# Patient Record
Sex: Female | Born: 1965
Health system: Southern US, Community
[De-identification: ages and names within clinical notes are randomized; demographics above are authoritative.]

## PROBLEM LIST (undated history)

## (undated) HISTORY — PX: CYST EXCISION: SHX5701

---

## 2002-05-01 ENCOUNTER — Encounter: Payer: Self-pay | Admitting: Emergency Medicine

## 2002-05-01 ENCOUNTER — Emergency Department (HOSPITAL_COMMUNITY): Admission: EM | Admit: 2002-05-01 | Discharge: 2002-05-02 | Payer: Self-pay | Admitting: *Deleted

## 2003-01-19 ENCOUNTER — Encounter: Payer: Self-pay | Admitting: Emergency Medicine

## 2003-01-19 ENCOUNTER — Emergency Department (HOSPITAL_COMMUNITY): Admission: EM | Admit: 2003-01-19 | Discharge: 2003-01-20 | Payer: Self-pay | Admitting: Emergency Medicine

## 2003-06-24 ENCOUNTER — Other Ambulatory Visit: Admission: RE | Admit: 2003-06-24 | Discharge: 2003-06-24 | Payer: Self-pay | Admitting: Gynecology

## 2003-06-26 ENCOUNTER — Encounter: Payer: Self-pay | Admitting: Gynecology

## 2003-06-26 ENCOUNTER — Encounter: Admission: RE | Admit: 2003-06-26 | Discharge: 2003-06-26 | Payer: Self-pay | Admitting: Gynecology

## 2004-12-30 ENCOUNTER — Emergency Department (HOSPITAL_COMMUNITY): Admission: EM | Admit: 2004-12-30 | Discharge: 2004-12-30 | Payer: Self-pay | Admitting: Emergency Medicine

## 2006-11-13 ENCOUNTER — Other Ambulatory Visit: Admission: RE | Admit: 2006-11-13 | Discharge: 2006-11-13 | Payer: Self-pay | Admitting: Family Medicine

## 2007-03-25 ENCOUNTER — Encounter: Admission: RE | Admit: 2007-03-25 | Discharge: 2007-03-25 | Payer: Self-pay | Admitting: Family Medicine

## 2008-02-24 ENCOUNTER — Other Ambulatory Visit: Admission: RE | Admit: 2008-02-24 | Discharge: 2008-02-24 | Payer: Self-pay | Admitting: Family Medicine

## 2008-12-07 ENCOUNTER — Other Ambulatory Visit: Admission: RE | Admit: 2008-12-07 | Discharge: 2008-12-07 | Payer: Self-pay | Admitting: Family Medicine

## 2013-01-17 ENCOUNTER — Other Ambulatory Visit: Payer: Self-pay | Admitting: Physician Assistant

## 2013-01-17 ENCOUNTER — Other Ambulatory Visit (HOSPITAL_COMMUNITY)
Admission: RE | Admit: 2013-01-17 | Discharge: 2013-01-17 | Disposition: A | Discharge status: Home / Self Care | Payer: BC Managed Care – PPO | Source: Ambulatory Visit | Attending: Family Medicine | Admitting: Family Medicine

## 2013-01-17 DIAGNOSIS — Z01419 Encounter for gynecological examination (general) (routine) without abnormal findings: Secondary | ICD-10-CM | POA: Insufficient documentation

## 2014-09-02 ENCOUNTER — Emergency Department (HOSPITAL_BASED_OUTPATIENT_CLINIC_OR_DEPARTMENT_OTHER)
Admission: EM | Admit: 2014-09-02 | Discharge: 2014-09-03 | Disposition: A | Discharge status: Home / Self Care | Payer: BC Managed Care – PPO | Attending: Emergency Medicine | Admitting: Emergency Medicine

## 2014-09-02 ENCOUNTER — Encounter (HOSPITAL_BASED_OUTPATIENT_CLINIC_OR_DEPARTMENT_OTHER): Payer: Self-pay

## 2014-09-02 DIAGNOSIS — G5601 Carpal tunnel syndrome, right upper limb: Secondary | ICD-10-CM | POA: Diagnosis not present

## 2014-09-02 DIAGNOSIS — R591 Generalized enlarged lymph nodes: Secondary | ICD-10-CM | POA: Insufficient documentation

## 2014-09-02 DIAGNOSIS — J069 Acute upper respiratory infection, unspecified: Secondary | ICD-10-CM | POA: Diagnosis not present

## 2014-09-02 DIAGNOSIS — G5603 Carpal tunnel syndrome, bilateral upper limbs: Secondary | ICD-10-CM

## 2014-09-02 DIAGNOSIS — R590 Localized enlarged lymph nodes: Secondary | ICD-10-CM

## 2014-09-02 DIAGNOSIS — G5602 Carpal tunnel syndrome, left upper limb: Secondary | ICD-10-CM | POA: Diagnosis not present

## 2014-09-02 MED ORDER — IBUPROFEN 400 MG PO TABS
600.0000 mg | ORAL_TABLET | Freq: Once | ORAL | Status: AC
  Administered 2014-09-03: 600 mg via ORAL
  Filled 2014-09-02 (×2): qty 1

## 2014-09-02 NOTE — ED Notes (Signed)
C/o "lump" to left side of neck x 2 hours

## 2014-09-02 NOTE — ED Provider Notes (Signed)
CSN: [id number]     Arrival date & time 09/02/14  2208 History  This chart was scribed for Brenda Raceravid Suprena Travaglini, MD by Evon Slackerrance Branch, ED Scribe. This patient was seen in room MH08/MH08 and the patient's care was started at 11:07 PM.     Chief Complaint  Patient presents with  . Lymphadenopathy   HPI HPI Comments: Brenda Cooper is a 48 y.o. female who presents to the Emergency Department complaining of left sided cervical mass onset 2 hours PTA. She states that its painful and burning. She states she has some rhinorrhea. No fever or chills.  Patient also complains of episodic bilateral hand numbness especially over the first 3 digits of either hand for the past several weeks. This is worse at night. 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 She's had no weakness. History reviewed. No pertinent past medical history. Past Surgical History  Procedure Laterality Date  . Cyst excision     No family history on file. History  Substance Use Topics  . Smoking status: Never Smoker   . Smokeless tobacco: Not on file  . Alcohol Use: No   OB History    No data available     Review of Systems  Constitutional: Negative for fever and chills.  HENT: Positive for congestion and rhinorrhea. Negative for sinus pressure and sore throat.   Eyes: Negative for visual disturbance.  Respiratory: Negative for chest tightness and shortness of breath.   Cardiovascular: Negative for chest pain.  Gastrointestinal: Negative for nausea and abdominal pain.  Musculoskeletal: Positive for neck pain. Negative for back pain and neck stiffness.  Skin: Negative for rash and wound.  Neurological: Negative for dizziness, weakness, numbness and headaches.  All other systems reviewed and are negative.     Allergies  Review of patient's allergies indicates no known allergies.  Home Medications   Prior to Admission medications   Medication Sig Start Date End Date Taking? Authorizing Provider  FLUoxetine  (PROZAC) 40 MG capsule Take 40 mg by mouth daily.   Yes Historical Provider, MD  Montelukast Sodium (SINGULAIR PO) Take by mouth.   Yes Historical Provider, MD  ibuprofen (ADVIL,MOTRIN) 600 MG tablet Take 1 tablet (600 mg total) by mouth every 6 (six) hours as needed. 09/03/14   Brenda Raceravid Kinan Safley, MD   BP 122/78 mmHg  Pulse 88  Temp(Src) 97.7 F (36.5 C) (Oral)  Resp 18  Ht 5\' 5"  (1.651 m)  Wt 260 lb (117.935 kg)  BMI 43.27 kg/m2  SpO2 100%  LMP 08/12/2014   Physical Exam  Constitutional: She is oriented to person, place, and time. She appears well-developed and well-nourished. No distress.  HENT:  Head: Normocephalic and atraumatic.  Mouth/Throat: Oropharynx is clear and moist. No oropharyngeal exudate.  Eyes: EOM are normal. Pupils are equal, round, and reactive to light.  Neck: Normal range of motion. Neck supple.  No meningismus  Cardiovascular: Normal rate and regular rhythm.   Pulmonary/Chest: Effort normal and breath sounds normal. No respiratory distress. She has no wheezes. She has no rales. She exhibits no tenderness.  Abdominal: Soft. Bowel sounds are normal. She exhibits no distension and no mass. There is no tenderness. There is no rebound and no guarding.  Musculoskeletal: Normal range of motion. She exhibits no edema or tenderness.  Negative Tinel's and Phalen's sign.  Lymphadenopathy:    She has cervical adenopathy (left-sided posterior cervical lymphadenopathy. ).  Neurological: She is alert and oriented to person, place, and time.  Patient  is alert and oriented x3 with clear, goal oriented speech. Patient has 5/5 motor in all extremities. Sensation is intact to light touch. Patient has a normal gait and walks without assistance.  Skin: Skin is warm and dry. No rash noted. No erythema.  Psychiatric: She has a normal mood and affect. Her behavior is normal.  Nursing note and vitals reviewed.   ED Course  Procedures (including critical care time) Labs Review Labs  Reviewed - No data to display  Imaging Review No results found.   EKG Interpretation None      MDM   Final diagnoses:  URI (upper respiratory infection)  Lymphadenopathy of left cervical region  Carpal tunnel syndrome on both sides      I personally performed the services described in this documentation, which was scribed in my presence. The recorded information has been reviewed and is accurate.  Patient with reactive lymphadenopathy likely due to URI. Patient also describes symptoms of possible tunnel syndrome. We'll place in a Velcro splints been referred to Hydrographic surveyorhand surgeon. Return precautions given.     Brenda Raceravid Nickson Middlesworth, MD 09/03/14 [phone]

## 2014-09-03 MED ORDER — IBUPROFEN 600 MG PO TABS: 600.0000 mg | ORAL_TABLET | Freq: Four times a day (QID) | ORAL | Status: AC | PRN

## 2014-09-03 NOTE — Discharge Instructions (Signed)
Carpal Tunnel Syndrome The carpal tunnel is a narrow area located on the palm side of your wrist. The tunnel is formed by the wrist bones and ligaments. Nerves, blood vessels, and tendons pass through the carpal tunnel. Repeated wrist motion or certain diseases may cause swelling within the tunnel. This swelling pinches the main nerve in the wrist (median nerve) and causes the painful hand and arm condition called carpal tunnel syndrome. CAUSES   Repeated wrist motions.  Wrist injuries.  Certain diseases like arthritis, diabetes, alcoholism, hyperthyroidism, and kidney failure.  Obesity.  Pregnancy. SYMPTOMS   A "pins and needles" feeling in your fingers or hand, especially in your thumb, index and middle fingers.  Tingling or numbness in your fingers or hand.  An aching feeling in your entire arm, especially when your wrist and elbow are bent for long periods of time.  Wrist pain that goes up your arm to your shoulder.  Pain that goes down into your palm or fingers.  A weak feeling in your hands. DIAGNOSIS  Your health care provider will take your history and perform a physical exam. An electromyography test may be needed. This test measures electrical signals sent out by your nerves into the muscles. The electrical signals are usually slowed by carpal tunnel syndrome. You may also need X-rays. TREATMENT  Carpal tunnel syndrome may clear up by itself. Your health care provider may recommend a wrist splint or medicine such as a nonsteroidal anti-inflammatory medicine. Cortisone injections may help. Sometimes, surgery may be needed to free the pinched nerve.  HOME CARE INSTRUCTIONS   Take all medicine as directed by your health care provider. Only take over-the-counter or prescription medicines for pain, discomfort, or fever as directed by your health care provider.  If you were given a splint to keep your wrist from bending, wear it as directed. It is important to wear the splint at  night. Wear the splint for as long as you have pain or numbness in your hand, arm, or wrist. This may take 1 to 2 months.  Rest your wrist from any activity that may be causing your pain. If your symptoms are work-related, you may need to talk to your employer about changing to a job that does not require using your wrist.  Put ice on your wrist after long periods of wrist activity.  Put ice in a plastic bag.  Place a towel between your skin and the bag.  Leave the ice on for 15-20 minutes, 03-04 times a day.  Keep all follow-up visits as directed by your health care provider. This includes any orthopedic referrals, physical therapy, and rehabilitation. Any delay in getting necessary care could result in a delay or failure of your condition to heal. SEEK IMMEDIATE MEDICAL CARE IF:   You have new, unexplained symptoms.  Your symptoms get worse and are not helped or controlled with medicines. MAKE SURE YOU:   Understand these instructions.  Will watch your condition.  Will get help right away if you are not doing well or get worse. Document Released: 09/01/2000 Document Revised: 01/19/2014 Document Reviewed: 07/21/2011 [facility] Patient Information 2015 [place], [place]. This information is not intended to replace advice given to you by your health care provider. Make sure you discuss any questions you have with your health care provider.  Lymphadenopathy Lymphadenopathy means "disease of the lymph glands." But the term is usually used to describe swollen or enlarged lymph glands, also called lymph nodes. These are the bean-shaped organs found in  many locations including the neck, underarm, and groin. Lymph glands are part of the immune system, which fights infections in your body. Lymphadenopathy can occur in just one area of the body, such as the neck, or it can be generalized, with lymph node enlargement in several areas. The nodes found in the neck are the most common sites of  lymphadenopathy. CAUSES When your immune system responds to germs (such as viruses or bacteria ), infection-fighting cells and fluid build up. This causes the glands to grow in size. Usually, this is not something to worry about. Sometimes, the glands themselves can become infected and inflamed. This is called lymphadenitis. Enlarged lymph nodes can be caused by many diseases:  Bacterial disease, such as strep throat or a skin infection.  Viral disease, such as a common cold.  Other germs, such as Lyme disease, tuberculosis, or sexually transmitted diseases.  Cancers, such as lymphoma (cancer of the lymphatic system) or leukemia (cancer of the white blood cells).  Inflammatory diseases such as lupus or rheumatoid arthritis.  Reactions to medications. Many of the diseases above are rare, but important. This is why you should see your caregiver if you have lymphadenopathy. SYMPTOMS  Swollen, enlarged lumps in the neck, back of the head, or other locations.  Tenderness.  Warmth or redness of the skin over the lymph nodes.  Fever. DIAGNOSIS Enlarged lymph nodes are often near the source of infection. They can help health care providers diagnose your illness. For instance:  Swollen lymph nodes around the jaw might be caused by an infection in the mouth.  Enlarged glands in the neck often signal a throat infection.  Lymph nodes that are swollen in more than one area often indicate an illness caused by a virus. Your caregiver will likely know what is causing your lymphadenopathy after listening to your history and examining you. Blood tests, x-rays, or other tests may be needed. If the cause of the enlarged lymph node cannot be found, and it does not go away by itself, then a biopsy may be needed. Your caregiver will discuss this with you. TREATMENT Treatment for your enlarged lymph nodes will depend on the cause. Many times the nodes will shrink to normal size by themselves, with no  treatment. Antibiotics or other medicines may be needed for infection. Only take over-the-counter or prescription medicines for pain, discomfort, or fever as directed by your caregiver. HOME CARE INSTRUCTIONS Swollen lymph glands usually return to normal when the underlying medical condition goes away. If they persist, contact your health-care provider. He/she might prescribe antibiotics or other treatments, depending on the diagnosis. Take any medications exactly as prescribed. Keep any follow-up appointments made to check on the condition of your enlarged nodes. SEEK MEDICAL CARE IF:  Swelling lasts for more than two weeks.  You have symptoms such as weight loss, night sweats, fatigue, or fever that does not go away.  The lymph nodes are hard, seem fixed to the skin, or are growing rapidly.  Skin over the lymph nodes is red and inflamed. This could mean there is an infection. SEEK IMMEDIATE MEDICAL CARE IF:  Fluid starts leaking from the area of the enlarged lymph node.  You develop a fever of 102 F (38.9 C) or greater.  Severe pain develops (not necessarily at the site of a large lymph node).  You develop chest pain or shortness of breath.  You develop worsening abdominal pain. MAKE SURE YOU:  Understand these instructions.  Will watch your condition.  Will get help right away if you are not doing well or get worse. Document Released: 06/13/2008 Document Revised: 01/19/2014 Document Reviewed: 06/13/2008 [facility] Patient Information 2015 [place]. This information is not intended to replace advice given to you by your health care provider. Make sure you discuss any questions you have with your health care provider.  Upper Respiratory Infection, Adult An upper respiratory infection (URI) is also sometimes known as the common cold. The upper respiratory tract includes the nose, sinuses, throat, trachea, and bronchi. Bronchi are the airways leading to the lungs. Most people  improve within 1 week, but symptoms can last up to 2 weeks. A residual cough may last even longer.  CAUSES Many different viruses can infect the tissues lining the upper respiratory tract. The tissues become irritated and inflamed and often become very moist. Mucus production is also common. A cold is contagious. You can easily spread the virus to others by oral contact. This includes kissing, sharing a glass, coughing, or sneezing. Touching your mouth or nose and then touching a surface, which is then touched by another person, can also spread the virus. SYMPTOMS  Symptoms typically develop 1 to 3 days after you come in contact with a cold virus. Symptoms vary from person to person. They may include:  Runny nose.  Sneezing.  Nasal congestion.  Sinus irritation.  Sore throat.  Loss of voice (laryngitis).  Cough.  Fatigue.  Muscle aches.  Loss of appetite.  Headache.  Low-grade fever. DIAGNOSIS  You might diagnose your own cold based on familiar symptoms, since most people get a cold 2 to 3 times a year. Your caregiver can confirm this based on your exam. Most importantly, your caregiver can check that your symptoms are not due to another disease such as strep throat, sinusitis, pneumonia, asthma, or epiglottitis. Blood tests, throat tests, and X-rays are not necessary to diagnose a common cold, but they may sometimes be helpful in excluding other more serious diseases. Your caregiver will decide if any further tests are required. RISKS AND COMPLICATIONS  You may be at risk for a more severe case of the common cold if you smoke cigarettes, have chronic heart disease (such as heart failure) or lung disease (such as asthma), or if you have a weakened immune system. The very young and very old are also at risk for more serious infections. Bacterial sinusitis, middle ear infections, and bacterial pneumonia can complicate the common cold. The common cold can worsen asthma and chronic  obstructive pulmonary disease (COPD). Sometimes, these complications can require emergency medical care and may be life-threatening. PREVENTION  The best way to protect against getting a cold is to practice good hygiene. Avoid oral or hand contact with people with cold symptoms. Wash your hands often if contact occurs. There is no clear evidence that vitamin C, vitamin E, echinacea, or exercise reduces the chance of developing a cold. However, it is always recommended to get plenty of rest and practice good nutrition. TREATMENT  Treatment is directed at relieving symptoms. There is no cure. Antibiotics are not effective, because the infection is caused by a virus, not by bacteria. Treatment may include:  Increased fluid intake. Sports drinks offer valuable electrolytes, sugars, and fluids.  Breathing heated mist or steam (vaporizer or shower).  Eating chicken soup or other clear broths, and maintaining good nutrition.  Getting plenty of rest.  Using gargles or lozenges for comfort.  Controlling fevers with ibuprofen or acetaminophen as directed by your caregiver.  Increasing usage of your inhaler if you have asthma. Zinc gel and zinc lozenges, taken in the first 24 hours of the common cold, can shorten the duration and lessen the severity of symptoms. Pain medicines may help with fever, muscle aches, and throat pain. A variety of non-prescription medicines are available to treat congestion and runny nose. Your caregiver can make recommendations and may suggest nasal or lung inhalers for other symptoms.  HOME CARE INSTRUCTIONS   Only take over-the-counter or prescription medicines for pain, discomfort, or fever as directed by your caregiver.  Use a warm mist humidifier or inhale steam from a shower to increase air moisture. This may keep secretions moist and make it easier to breathe.  Drink enough water and fluids to keep your urine clear or pale yellow.  Rest as needed.  Return to work  when your temperature has returned to normal or as your caregiver advises. You may need to stay home longer to avoid infecting others. You can also use a face mask and careful hand washing to prevent spread of the virus. SEEK MEDICAL CARE IF:   After the first few days, you feel you are getting worse rather than better.  You need your caregiver's advice about medicines to control symptoms.  You develop chills, worsening shortness of breath, or brown or red sputum. These may be signs of pneumonia.  You develop yellow or brown nasal discharge or pain in the face, especially when you bend forward. These may be signs of sinusitis.  You develop a fever, swollen neck glands, pain with swallowing, or white areas in the back of your throat. These may be signs of strep throat. SEEK IMMEDIATE MEDICAL CARE IF:   You have a fever.  You develop severe or persistent headache, ear pain, sinus pain, or chest pain.  You develop wheezing, a prolonged cough, cough up blood, or have a change in your usual mucus (if you have chronic lung disease).  You develop sore muscles or a stiff neck. Document Released: 02/28/2001 Document Revised: 11/27/2011 Document Reviewed: 12/10/2013 [facility] Patient Information 2015 [place], [place]. This information is not intended to replace advice given to you by your health care provider. Make sure you discuss any questions you have with your health care provider.

## 2016-01-07 ENCOUNTER — Other Ambulatory Visit: Payer: Self-pay | Admitting: Surgical Oncology

## 2016-01-07 DIAGNOSIS — Z9989 Dependence on other enabling machines and devices: Secondary | ICD-10-CM

## 2016-01-07 DIAGNOSIS — G4733 Obstructive sleep apnea (adult) (pediatric): Secondary | ICD-10-CM

## 2016-01-25 ENCOUNTER — Ambulatory Visit
Admission: RE | Admit: 2016-01-25 | Discharge: 2016-01-25 | Disposition: A | Discharge status: Home / Self Care | Payer: BLUE CROSS/BLUE SHIELD | Source: Ambulatory Visit | Attending: Surgical Oncology | Admitting: Surgical Oncology

## 2016-01-25 DIAGNOSIS — G4733 Obstructive sleep apnea (adult) (pediatric): Secondary | ICD-10-CM

## 2016-01-25 DIAGNOSIS — Z9989 Dependence on other enabling machines and devices: Secondary | ICD-10-CM

## 2016-02-23 ENCOUNTER — Emergency Department (HOSPITAL_COMMUNITY)
Admission: EM | Admit: 2016-02-23 | Discharge: 2016-02-23 | Disposition: A | Discharge status: Home / Self Care | Payer: BLUE CROSS/BLUE SHIELD | Attending: Emergency Medicine | Admitting: Emergency Medicine

## 2016-02-23 ENCOUNTER — Encounter (HOSPITAL_COMMUNITY): Payer: Self-pay | Admitting: Emergency Medicine

## 2016-02-23 DIAGNOSIS — Z23 Encounter for immunization: Secondary | ICD-10-CM | POA: Insufficient documentation

## 2016-02-23 DIAGNOSIS — Z203 Contact with and (suspected) exposure to rabies: Secondary | ICD-10-CM | POA: Diagnosis not present

## 2016-02-23 MED ORDER — RABIES IMMUNE GLOBULIN 150 UNIT/ML IM INJ
20.0000 [IU]/kg | INJECTION | Freq: Once | INTRAMUSCULAR | Status: AC
Start: 2016-02-23 — End: 2016-02-23
  Administered 2016-02-23: 2625 [IU] via INTRAMUSCULAR
  Filled 2016-02-23: qty 17.5

## 2016-02-23 MED ORDER — RABIES VACCINE, PCEC IM SUSR
1.0000 mL | Freq: Once | INTRAMUSCULAR | Status: AC
  Administered 2016-02-23: 1 mL via INTRAMUSCULAR
  Filled 2016-02-23: qty 1

## 2016-02-23 MED ORDER — RABIES VIRUS VACCINE, HDC IM INJ: 1.0000 mL | INJECTION | Freq: Once | INTRAMUSCULAR | Status: DC

## 2016-02-23 MED ORDER — RABIES IMMUNE GLOBULIN 150 UNIT/ML IM INJ: 20.0000 [IU]/kg | INJECTION | Freq: Once | INTRAMUSCULAR | Status: DC

## 2016-02-23 NOTE — Discharge Instructions (Signed)
Please follow up with [facility] for further vaccine doses. See below                                    RABIES VACCINE FOLLOW UP  Patient's Name: Brenda Cooper                     Original Order Date:02/23/2016  Medical Record Number: [id number]  ED Physician: Lorre Nick, MD Primary Diagnosis: Rabies Exposure       PCP: Cala Bradford, MD  Patient Phone Number: (home) [phone] (home)    (cell)  Telephone Information:  Mobile [phone]    (work) [phone] (work) Species of Animal:     You have been seen in the Emergency Department for a possible rabies exposure. It's very important you return for the additional vaccine doses.  Please call the clinic listed below for hours of operation.   Clinic that will administer your rabies vaccines:    DAY 0:  02/23/2016      DAY 3:  02/26/2016       DAY 7:  03/01/2016     DAY 14:  03/08/2016          Rabies Vaccine: What You Need to Know WHAT IS RABIES?  Rabies is a serious disease. It is caused by a virus.  Rabies is mainly a disease of animals. Humans get rabies when they are bitten by infected animals.  At first there might not be any symptoms. But weeks, or even years after a bite, rabies can cause pain, fatigue, headaches, fever, and irritability. These are followed by seizures, hallucinations, and paralysis. Human rabies is almost always fatal.  Wild animals, especially bats, are the most common source of human rabies infection in the [place]. Skunks, raccoons, dogs, cats, coyotes, foxes, and other mammals can also transmit the disease.  Human rabies is rare in the [place]. There have been only 55 cases diagnosed since 1990. However, between 16,000 and 39,000 people are vaccinated each year as a precaution after animal bites. Also, rabies is far more common in other parts of the world, with about 40,000 to 70,000 rabies-related deaths worldwide each year. Bites from unvaccinated dogs cause most of  these cases. Rabies vaccine can prevent rabies. RABIES VACCINE  Rabies vaccine is given to people at high risk of rabies to protect them if they are exposed. It can also prevent the disease if it is given to a person after they have been exposed.  Rabies vaccine is made from killed rabies virus. It cannot cause rabies. WHO SHOULD GET RABIES VACCINE AND WHEN? Preventive Vaccination (No Exposure)  People at high risk of exposure to rabies, such as veterinarians, Educational psychologist, rabies laboratory workers, spelunkers, and rabies biologics production workers should be offered rabies vaccine.  The vaccine should also be considered for:  People whose activities bring them into frequent contact with rabies virus or with possibly rabid animals.  International travelers who are likely to come in contact with animals in parts of the world where rabies is common.  The pre-exposure schedule for rabies vaccination is 3 doses, given at the following times:  Dose 1: As appropriate.  Dose 2: 7 days after Dose 1.  Dose 3: 21 days or 28 days after Dose 1.  For laboratory workers and others who may be repeatedly exposed to rabies virus, periodic testing for immunity  is recommended and booster doses should be given as needed. (Testing or booster doses are not recommended for travelers). Ask your doctor for details. Vaccination After an Exposure Anyone who has been bitten by an animal, or who otherwise may have been exposed to rabies, should clean the wound and see a doctor immediately. The doctor will determine if they need to be vaccinated. A person who is exposed and has never been vaccinated against rabies should get 4 doses of rabies vaccine: one dose right away and additional doses on the 3rd, 7th, and 14th days. They should also get another shot called Rabies Immune Globulin at the same time as the first dose.  A person who has been previously vaccinated should get 2 doses of rabies vaccine: one  right away and another on the 3rd day. Rabies Immune Globulin is not needed. TELL YOUR DOCTOR IF: Talk with a doctor before getting rabies vaccine if you:  Ever had a serious (life-threatening) allergic reaction to a previous dose of rabies vaccine or to any component of the vaccine; tell your doctor if you have any severe allergies.  Have a weakened immune system because of:  HIV, AIDS, or another disease that affects the immune system.  Treatment with drugs that affect the immune system, such as steroids.  Cancer or cancer treatment with radiation or drugs. If you have a minor illness, such as a cold, you can be vaccinated. If you are moderately or severely ill, you should probably wait until you recover before getting a routine (non-exposure) dose of rabies vaccine. If you have been exposed to rabies virus, you should get the vaccine regardless of any other illnesses you may have. WHAT ARE THE RISKS FROM RABIES VACCINE? A vaccine, like any medicine, is capable of causing serious problems, such as severe allergic reactions. The risk of a vaccine causing serious harm, or death, is extremely small. Serious problems from rabies vaccine are very rare.  Mild problems:  Soreness, redness, swelling, or itching where the shot was given (30% to 74%).  Headache, nausea, abdominal pain, muscle aches, or dizziness (5% to 40%). Moderate problems:  Hives, pain in the joints, or fever (about 6% of booster doses).  Other nervous system disorders, such as Guillain-Barr Syndrome (GBS), have been reported after rabies vaccine, but this happens so rarely that it is not known whether they are related to the vaccine. Note: Several brands of rabies vaccine are available in the [place], and reactions may vary between brands. Your provider can give you more information about a particular brand. WHAT IF THERE IS A SERIOUS REACTION? What should I look for? Look for anything that concerns you, such as  signs of a severe allergic reaction, very high fever, or behavior changes.  Signs of a severe allergic reaction can include hives, swelling of the face and throat, difficulty breathing, a fast heartbeat, dizziness, and weakness. These would start a few minutes to a few hours after the vaccination. What should I do?  If you think it is a severe allergic reaction or other emergency that cannot wait, call 911 or get the person to the nearest hospital. Otherwise, call your doctor.  Afterward, the reaction should be reported to the Vaccine Adverse Event Reporting System (VAERS). Your doctor might file this report, or you can do it yourself through the VAERS website at www.vaers.LAg[place].nohhs.gov or by calling 1-567-186-8421. VAERS is only for reporting reactions. They do not give medical advice. HOW CAN I LEARN MORE?  Ask your  doctor.  Call your local or state health department.  Contact the Centers for Disease Control and Prevention (CDC):  Visit the CDC rabies website at wwwcrv.com CDC Rabies Vaccine VIS (06/23/08)   This information is not intended to replace advice given to you by your health care provider. Make sure you discuss any questions you have with your health care provider.   Document Released: 07/02/2006 Document Revised: 01/19/2015 Document Reviewed: 12/25/2012 Elsevier Interactive Patient Education [employer]

## 2016-02-23 NOTE — ED Provider Notes (Signed)
CSN: [id number]     Arrival date & time 02/23/16  1525 History  By signing my name below, I, Tanda Rockers, attest that this documentation has been prepared under the direction and in the presence of Amin Fornwalt, PA-C.  Electronically Signed: Tanda Rockers, ED Scribe. 02/23/2016. 3:55 PM.  No chief complaint on file.  The history is provided by the patient.    HPI Comments: Brenda Cooper is a 50 y.o. female who presents to the Emergency Department for a possible overnight exposure to bats x 2 days ago. Pt reports a bat was found inside the bathroom of the house in the morning. She is unsure if bat was inside the house overnight. She reports that they removed the bat from the house, but found another bat inside the house the next day. She was not exposed to the second bat due to locking self in room and waiting until the bat flew out. Pt denies known bites or abrasions from the bat. Denies any symptoms.   No past medical history on file. Past Surgical History  Procedure Laterality Date  . Cyst excision     No family history on file. Social History  Substance Use Topics  . Smoking status: Never Smoker   . Smokeless tobacco: Not on file  . Alcohol Use: No   OB History    No data available     Review of Systems  Constitutional: Negative for fever.  Skin: Negative for wound.   Allergies  Review of patient's allergies indicates no known allergies.  Home Medications   Prior to Admission medications   Medication Sig Start Date End Date Taking? Authorizing Provider  FLUoxetine (PROZAC) 40 MG capsule Take 40 mg by mouth daily.    Historical Provider, MD  ibuprofen (ADVIL,MOTRIN) 600 MG tablet Take 1 tablet (600 mg total) by mouth every 6 (six) hours as needed. 09/03/14   Loren Racer, MD  Montelukast Sodium (SINGULAIR PO) Take by mouth.    Historical Provider, MD   LMP 11/25/2015 (Approximate) Physical Exam  Constitutional: She is oriented to person, place, and time. She  appears well-developed and well-nourished. No distress.  HENT:  Head: Normocephalic and atraumatic.  Eyes: Conjunctivae and EOM are normal.  Neck: Neck supple. No tracheal deviation present.  Cardiovascular: Normal rate.   Pulmonary/Chest: Effort normal and breath sounds normal.  Abdominal: Soft. There is no tenderness. There is no rebound, no guarding and no CVA tenderness.  Musculoskeletal: Normal range of motion.  Neurological: She is alert and oriented to person, place, and time.  Skin: Skin is warm and dry.  Psychiatric: She has a normal mood and affect. Her behavior is normal.  Nursing note and vitals reviewed.   ED Course  Procedures (including critical care time) DIAGNOSTIC STUDIES: Oxygen Saturation is 100% on RA, normal by my interpretation.   COORDINATION OF CARE: 3:52 PM-Discussed treatment plan which includes Rabies vaccine and rabies immune globulin  with pt at bedside and pt agreed to plan.    MDM   Final diagnoses:  Need for post exposure prophylaxis for rabies   Patient in emergency department after finding a bat in her house 2 days ago. That appeared to be ill. Based on CDC recommendation, an unknown period of time of that in the house, will do vaccinations and administer immunoglobulin. Patient received her medications in emergency department and will follow-up with urgent care for further treatment. Patient does not have any obvious bite marks or injuries from the bat. Return  precautions given.  Filed Vitals:   02/23/16 1535 02/23/16 1540 02/23/16 1554  BP: 104/76    Pulse: 81    Temp: 98.2 F (36.8 C)    TempSrc: Oral    Resp: 81 18   Weight:   131.203 kg  SpO2: 100%       Jaynie Crumbleatyana Ripken Rekowski, PA-C 02/23/16 1711  Lorre NickAnthony Allen, MD 02/23/16 2023

## 2016-02-23 NOTE — ED Notes (Signed)
Patient noticed bat in house and presents today because of exposure. Denies fever, rash, bite marks or other c/c.

## 2016-02-26 ENCOUNTER — Encounter (HOSPITAL_COMMUNITY): Payer: Self-pay | Admitting: Emergency Medicine

## 2016-02-26 ENCOUNTER — Ambulatory Visit (HOSPITAL_COMMUNITY)
Admission: EM | Admit: 2016-02-26 | Discharge: 2016-02-26 | Disposition: A | Discharge status: Home / Self Care | Payer: BLUE CROSS/BLUE SHIELD | Attending: Family Medicine | Admitting: Family Medicine

## 2016-02-26 DIAGNOSIS — Z203 Contact with and (suspected) exposure to rabies: Secondary | ICD-10-CM

## 2016-02-26 MED ORDER — RABIES VACCINE, PCEC IM SUSR
1.0000 mL | Freq: Once | INTRAMUSCULAR | Status: AC
  Administered 2016-02-26: 1 mL via INTRAMUSCULAR

## 2016-02-26 NOTE — Discharge Instructions (Signed)
Return on 6/14 for 3rd rabies vaccination °

## 2016-02-26 NOTE — ED Notes (Signed)
Here for day 3 rabies vaccination (#2)... Voices no new concerns.  

## 2016-03-01 ENCOUNTER — Ambulatory Visit (HOSPITAL_COMMUNITY)
Admission: EM | Admit: 2016-03-01 | Discharge: 2016-03-01 | Disposition: A | Discharge status: Home / Self Care | Payer: BLUE CROSS/BLUE SHIELD | Attending: Emergency Medicine | Admitting: Emergency Medicine

## 2016-03-01 ENCOUNTER — Encounter (HOSPITAL_COMMUNITY): Payer: Self-pay | Admitting: Emergency Medicine

## 2016-03-01 DIAGNOSIS — Z203 Contact with and (suspected) exposure to rabies: Secondary | ICD-10-CM | POA: Diagnosis not present

## 2016-03-01 MED ORDER — RABIES VACCINE, PCEC IM SUSR
INTRAMUSCULAR | Status: AC
  Filled 2016-03-01: qty 1

## 2016-03-01 MED ORDER — RABIES VACCINE, PCEC IM SUSR
1.0000 mL | Freq: Once | INTRAMUSCULAR | Status: AC
  Administered 2016-03-01: 1 mL via INTRAMUSCULAR

## 2016-03-01 NOTE — ED Notes (Signed)
The patient presented to the [facility] with a complaint of needing the 3rd injection in the rabies series. 

## 2016-03-01 NOTE — Discharge Instructions (Signed)
Please return to [facility]gent Care Center on 03/08/2016 for your final vaccination.

## 2016-03-08 ENCOUNTER — Encounter (HOSPITAL_COMMUNITY): Payer: Self-pay

## 2016-03-08 ENCOUNTER — Ambulatory Visit (HOSPITAL_COMMUNITY)
Admission: EM | Admit: 2016-03-08 | Discharge: 2016-03-08 | Disposition: A | Discharge status: Home / Self Care | Payer: BLUE CROSS/BLUE SHIELD | Attending: Emergency Medicine | Admitting: Emergency Medicine

## 2016-03-08 DIAGNOSIS — Z203 Contact with and (suspected) exposure to rabies: Secondary | ICD-10-CM

## 2016-03-08 MED ORDER — RABIES VACCINE, PCEC IM SUSR
1.0000 mL | Freq: Once | INTRAMUSCULAR | Status: AC
  Administered 2016-03-08: 1 mL via INTRAMUSCULAR

## 2016-03-08 MED ORDER — RABIES VACCINE, PCEC IM SUSR
INTRAMUSCULAR | Status: AC
  Filled 2016-03-08: qty 1

## 2016-03-08 NOTE — ED Notes (Signed)
[facility] with [facility] complaint of needing the 4th injection in the rabies series.

## 2016-03-08 NOTE — Discharge Instructions (Signed)
Return to Urgent Care with any problems.

## 2016-12-12 ENCOUNTER — Other Ambulatory Visit: Payer: Self-pay

## 2016-12-12 DIAGNOSIS — N63 Unspecified lump in unspecified breast: Secondary | ICD-10-CM

## 2017-01-03 ENCOUNTER — Other Ambulatory Visit: Payer: Self-pay | Admitting: Family Medicine

## 2017-01-03 DIAGNOSIS — N6320 Unspecified lump in the left breast, unspecified quadrant: Secondary | ICD-10-CM

## 2017-01-03 DIAGNOSIS — N6310 Unspecified lump in the right breast, unspecified quadrant: Secondary | ICD-10-CM

## 2017-01-05 ENCOUNTER — Ambulatory Visit
Admission: RE | Admit: 2017-01-05 | Discharge: 2017-01-05 | Disposition: A | Discharge status: Home / Self Care | Payer: BLUE CROSS/BLUE SHIELD | Source: Ambulatory Visit | Attending: Family Medicine | Admitting: Family Medicine

## 2017-01-05 DIAGNOSIS — N6320 Unspecified lump in the left breast, unspecified quadrant: Secondary | ICD-10-CM

## 2017-01-05 DIAGNOSIS — N6310 Unspecified lump in the right breast, unspecified quadrant: Secondary | ICD-10-CM

## 2017-11-08 ENCOUNTER — Other Ambulatory Visit: Payer: Self-pay | Admitting: Family Medicine

## 2017-11-08 ENCOUNTER — Other Ambulatory Visit (HOSPITAL_COMMUNITY)
Admission: RE | Admit: 2017-11-08 | Discharge: 2017-11-08 | Disposition: A | Discharge status: Home / Self Care | Payer: 59 | Source: Ambulatory Visit | Attending: Family Medicine | Admitting: Family Medicine

## 2017-11-08 DIAGNOSIS — Z01419 Encounter for gynecological examination (general) (routine) without abnormal findings: Secondary | ICD-10-CM | POA: Diagnosis present

## 2017-11-09 LAB — CYTOLOGY - PAP: DIAGNOSIS: NEGATIVE

## 2019-04-12 IMAGING — US ULTRASOUND LEFT BREAST LIMITED
1 series · 12 of 12 positions shown · non-contrast
Comparison: Previous mammogram dated 03/25/2007.

CLINICAL DATA: Patient describes palpable lumps within each breast.

EXAM:
2D DIGITAL DIAGNOSTIC BILATERAL MAMMOGRAM WITH CAD AND ADJUNCT TOMO
ULTRASOUND BILATERAL BREAST

[Series 1: ultrasound left breast limited · 0.07mm/px · 12 of 12 slices shown]
[im 1/12]
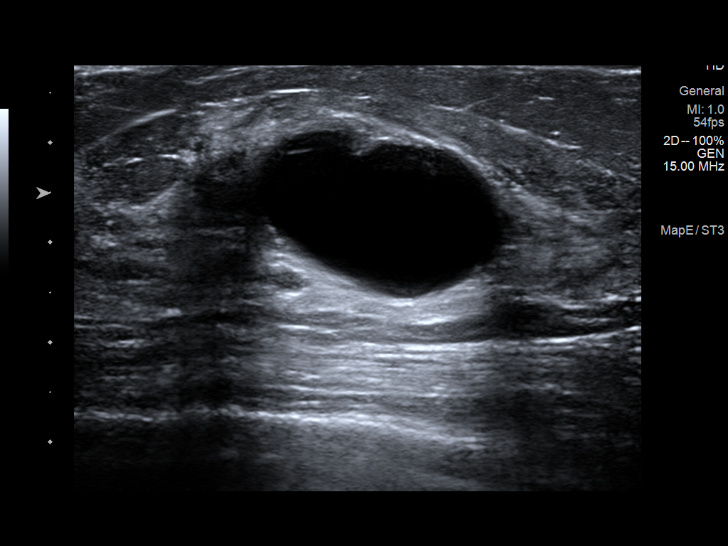
[im 2/12]
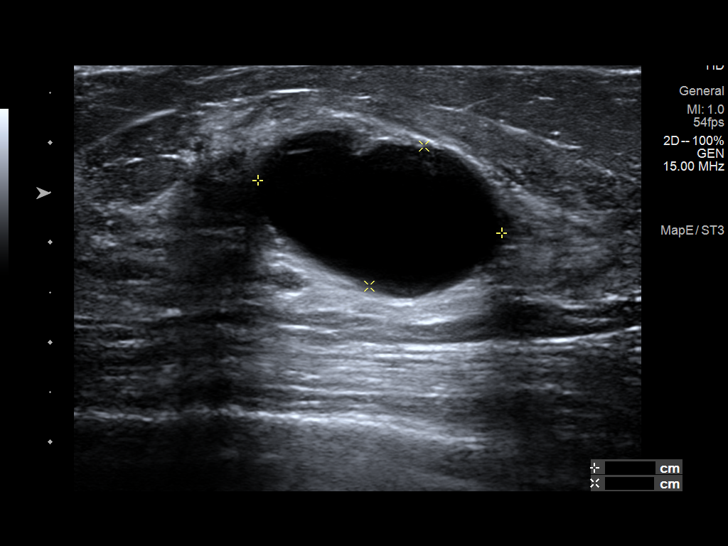
[im 3/12]
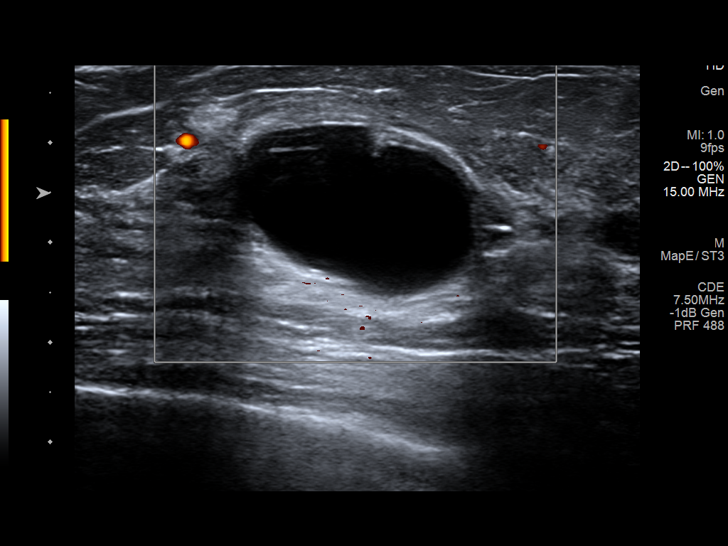
[im 4/12]
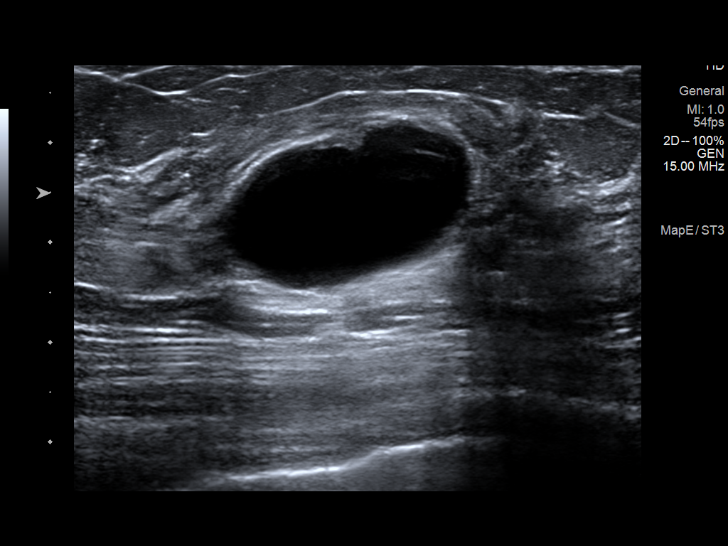
[im 5/12]
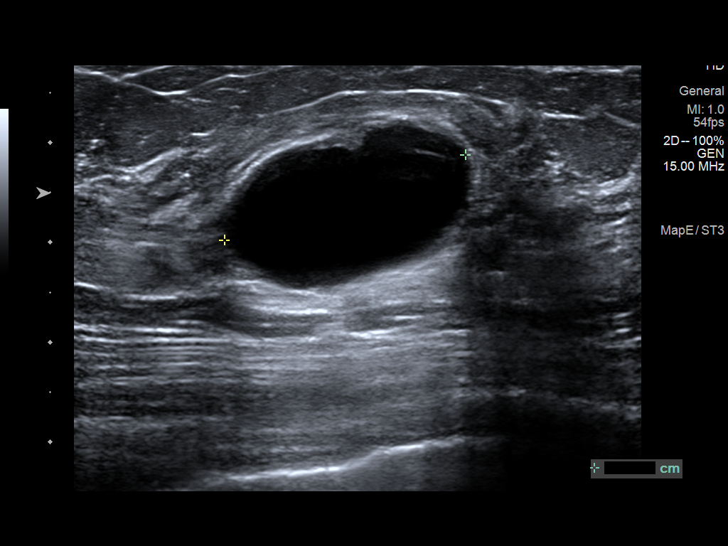
[im 6/12]
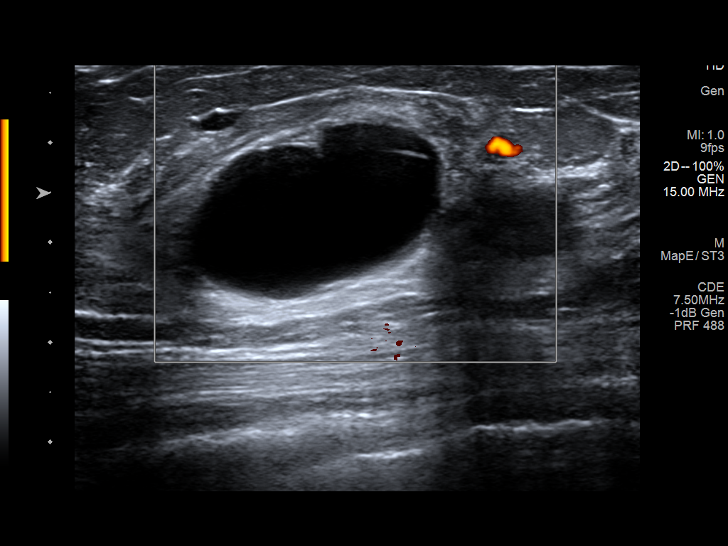
[im 7/12]
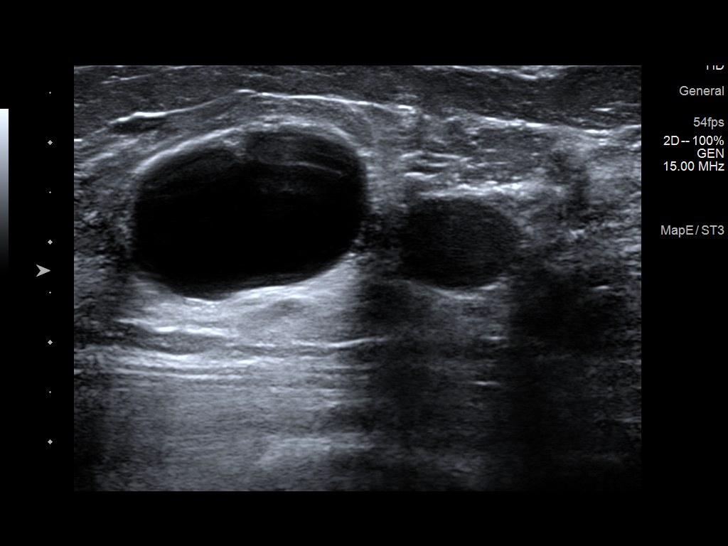
[im 8/12]
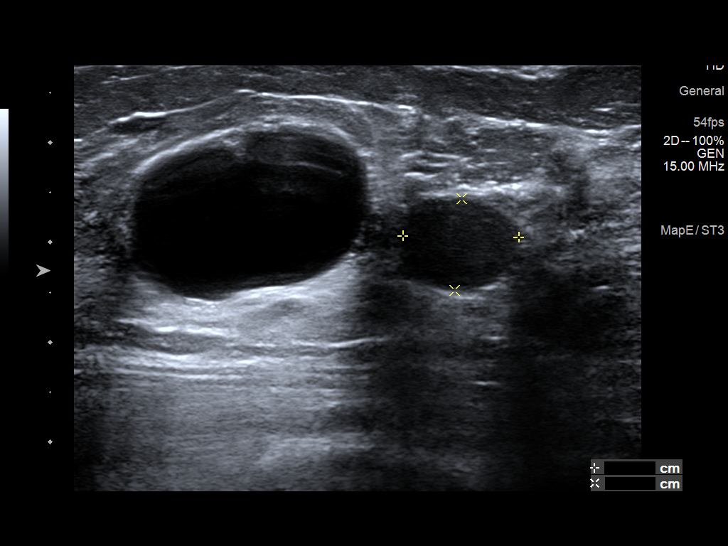
[im 9/12]
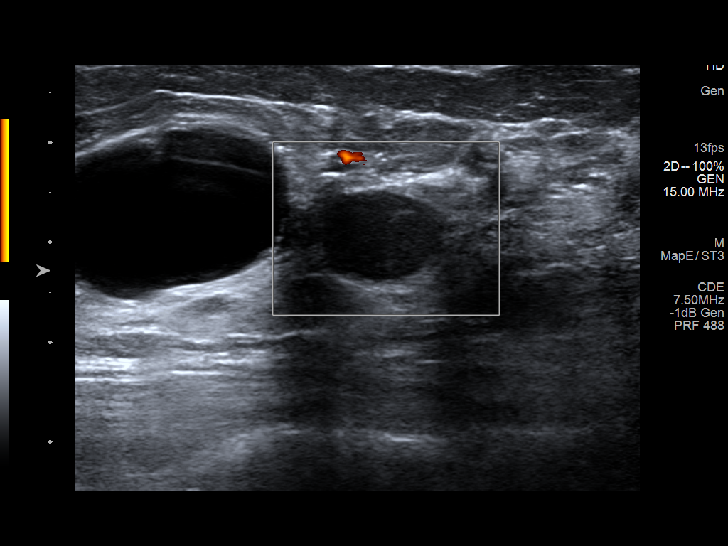
[im 10/12]
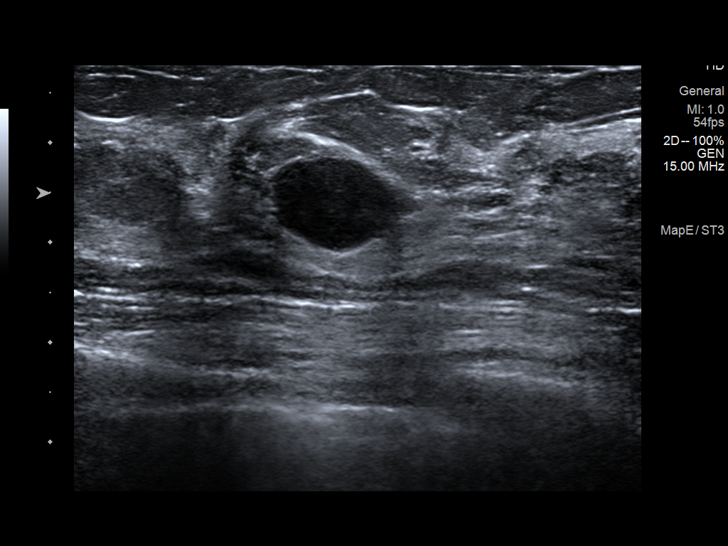
[im 11/12]
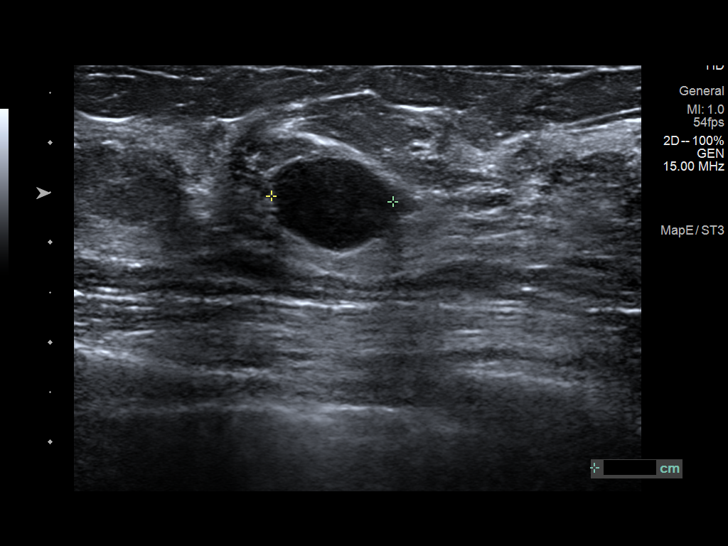
[im 12/12]
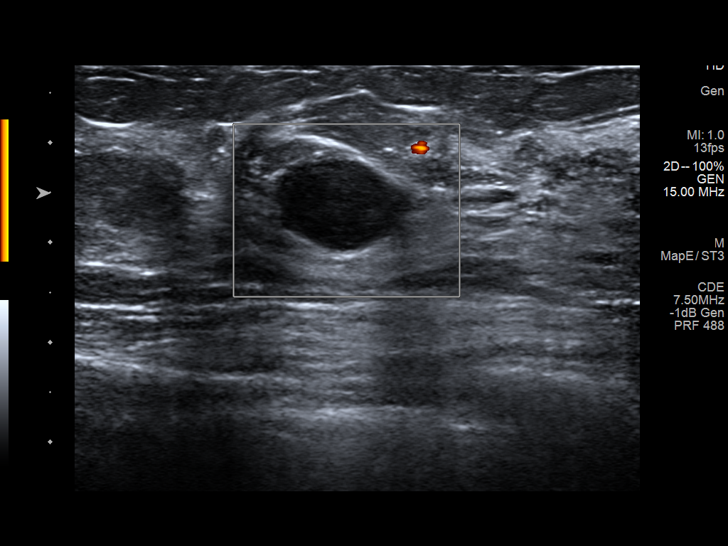

[12 of 12 positions shown; findings below may reference images not displayed]

ACR Breast Density Category d: The breast tissue is extremely dense,
which lowers the sensitivity of mammography.
FINDINGS: There are oval circumscribed masses within the upper breasts
bilaterally, corresponding to the areas of clinical concern. There
are no dominant masses, suspicious calcifications or secondary signs
of malignancy elsewhere within either breast.

Mammographic images were processed with CAD.

Targeted ultrasound is performed, showing benign cysts within each
breast, some simple and some complicated, corresponding to the sites
of patient's palpable lumps. Largest cyst within the right breast is
at the 12 o'clock axis, 2 cm from the nipple, measuring 2.8 x
cm. Largest cyst within the left breast is at the 11 o'clock axis, 2
cm from the nipple, measuring 2.6 x 1.5 cm. There are no suspicious
solid or cystic masses identified within either breast.
IMPRESSION: No evidence of malignancy within either breast. Benign cysts within
each breast, corresponding to the areas of clinical concern.

RECOMMENDATION:
Screening mammogram in one year.(Code:OJ-I-STM)

I have discussed the findings and recommendations with the patient.
Results were also provided in writing at the conclusion of the
visit. If applicable, a reminder letter will be sent to the patient
regarding the next appointment.

BI-RADS CATEGORY  2: Benign.

## 2019-04-12 IMAGING — MG 2D DIGITAL DIAGNOSTIC BILATERAL MAMMOGRAM WITH CAD AND ADJUNCT T
8 of 17 series · 8 of 40 positions shown · non-contrast
Comparison: Previous mammogram dated 03/25/2007.

CLINICAL DATA: Patient describes palpable lumps within each breast.

EXAM:
2D DIGITAL DIAGNOSTIC BILATERAL MAMMOGRAM WITH CAD AND ADJUNCT TOMO
ULTRASOUND BILATERAL BREAST

[L TAN]
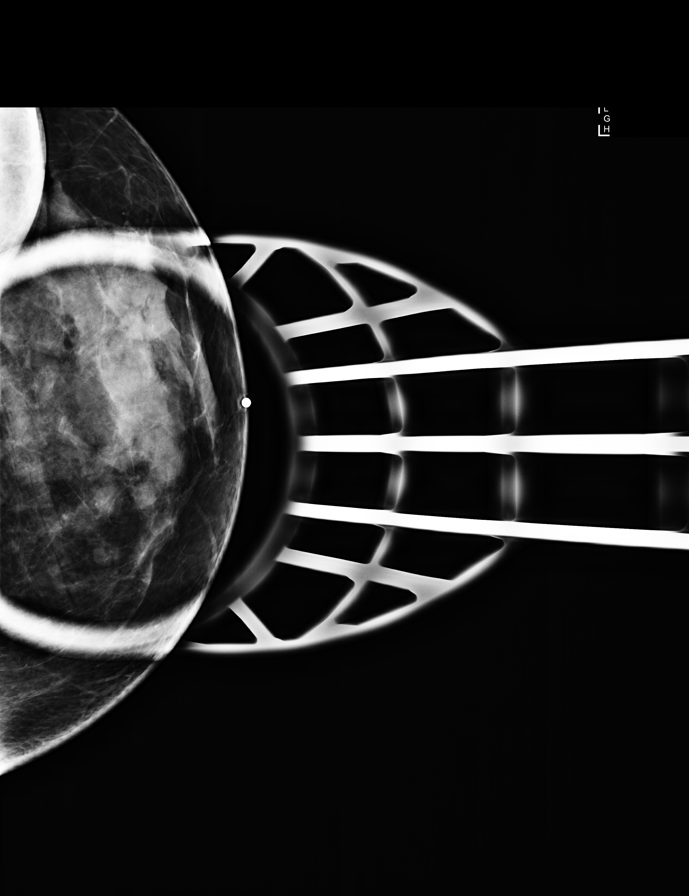

[L CC synth-2D]
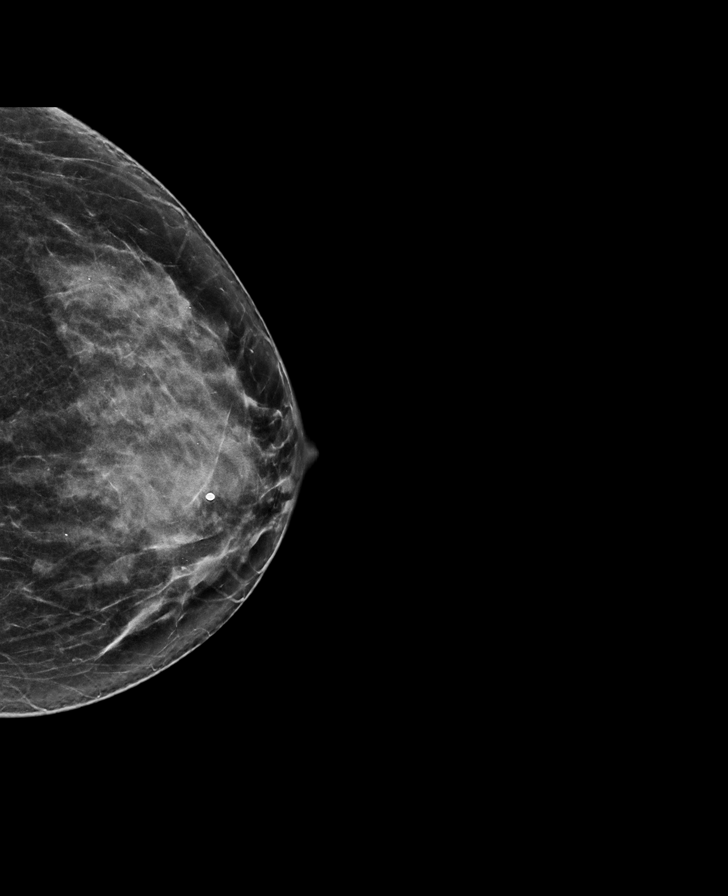

[L MLO synth-2D]
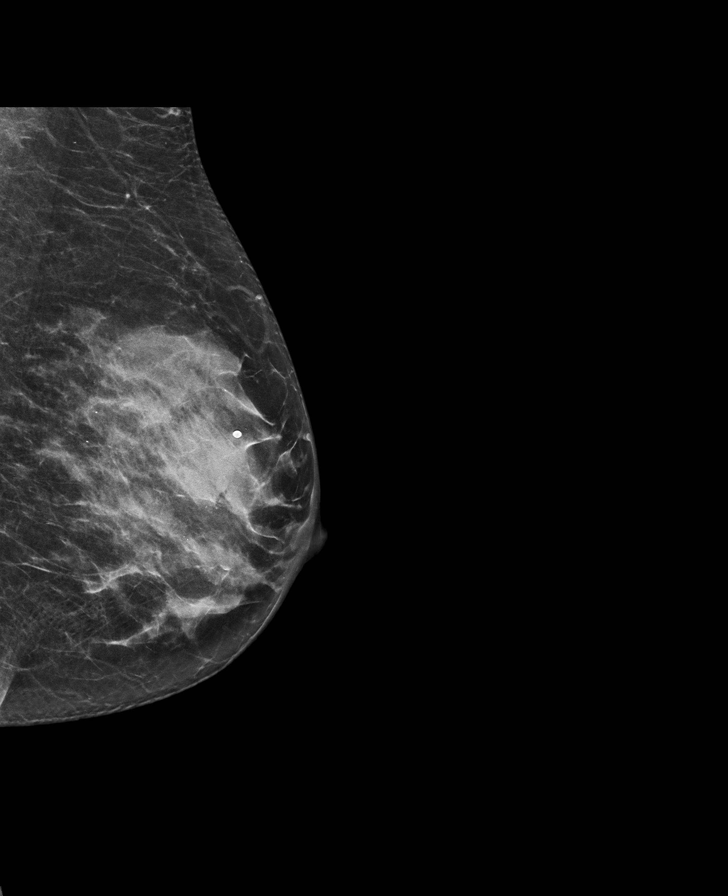

[R MLO synth-2D]
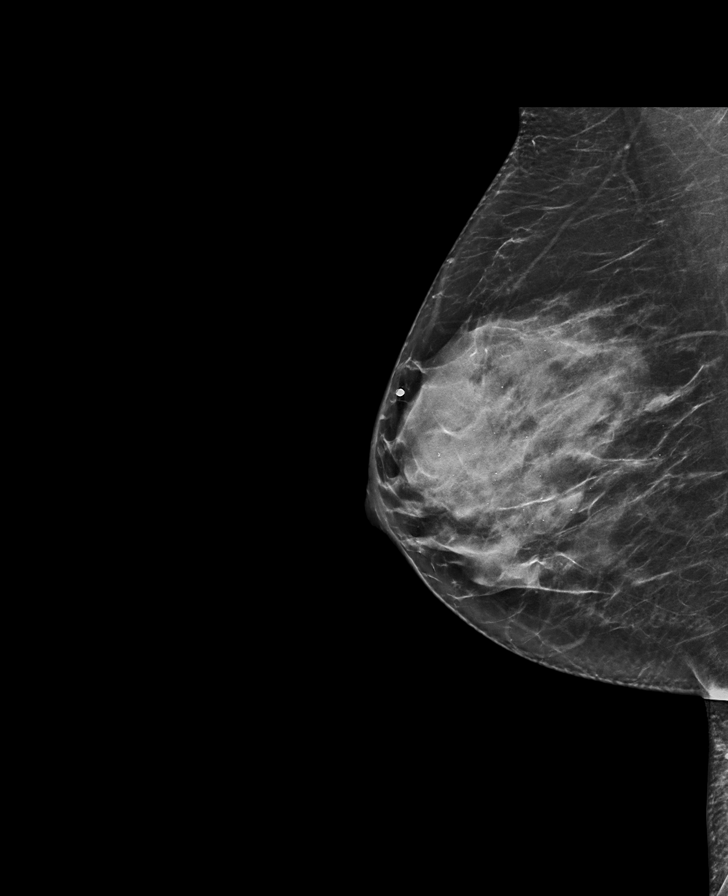

[R TAN synth-2D]
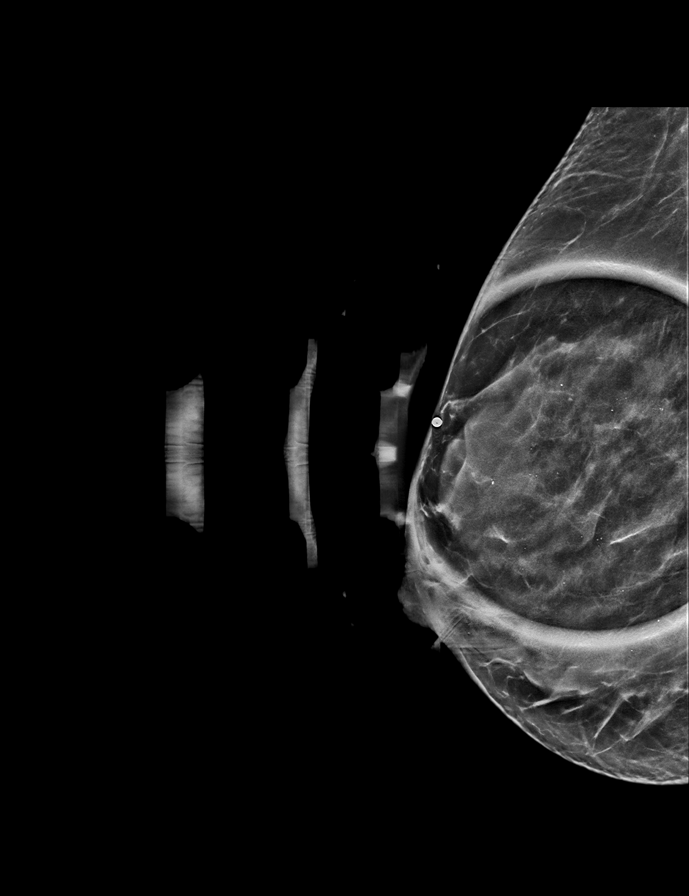

[R CC synth-2D]
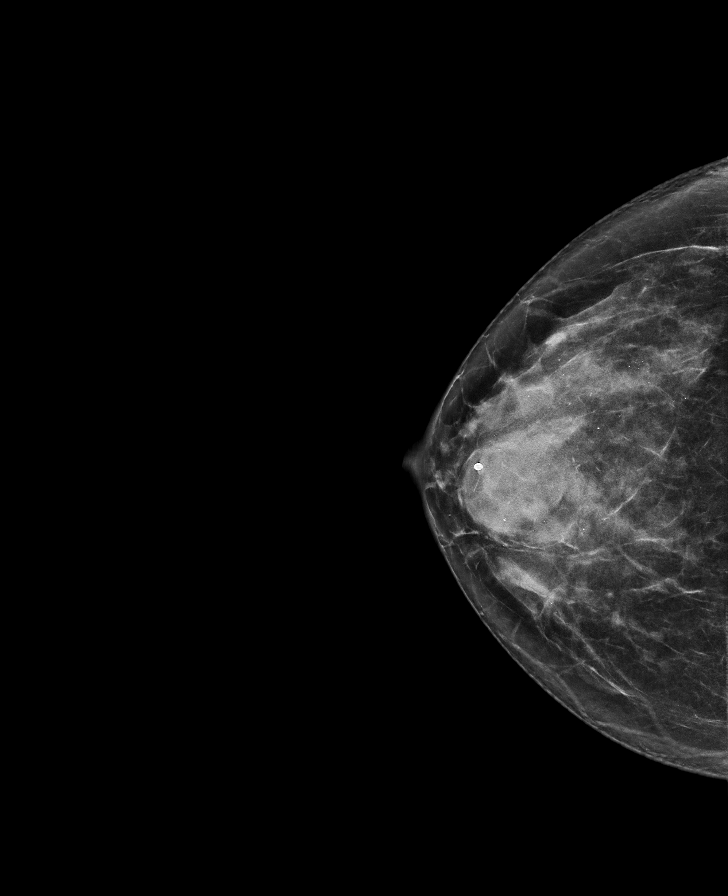

[L CC]
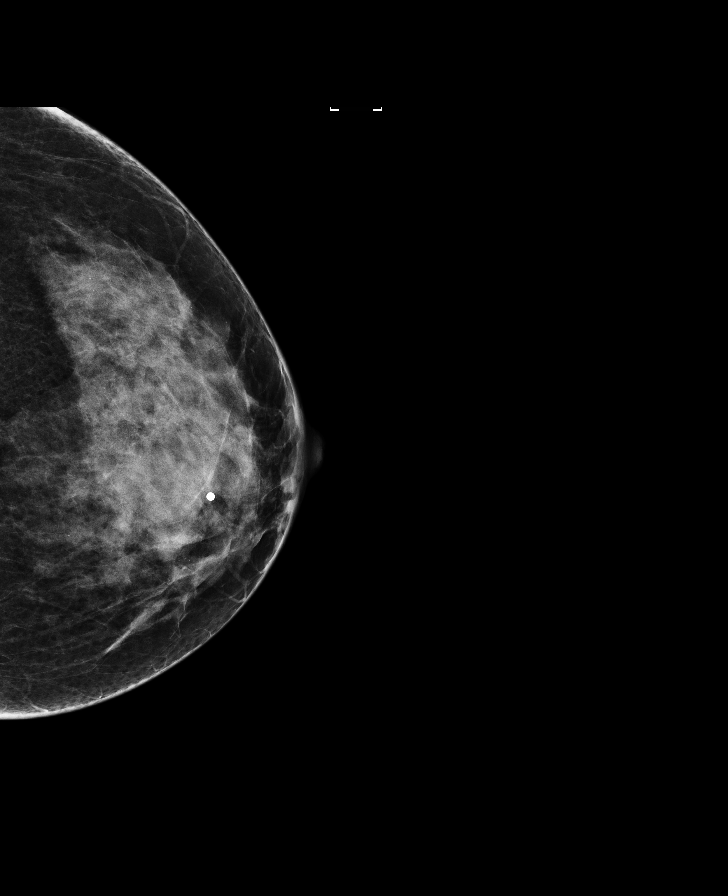

[L TAN synth-2D]
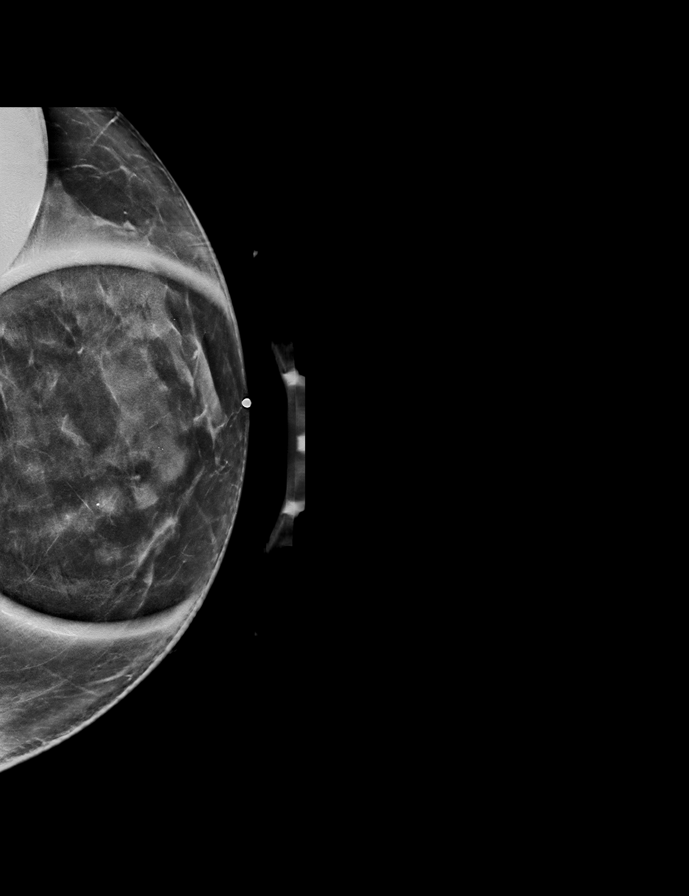

[8 of 40 positions shown; findings below may reference images not displayed]

ACR Breast Density Category d: The breast tissue is extremely dense,
which lowers the sensitivity of mammography.
FINDINGS: There are oval circumscribed masses within the upper breasts
bilaterally, corresponding to the areas of clinical concern. There
are no dominant masses, suspicious calcifications or secondary signs
of malignancy elsewhere within either breast.

Mammographic images were processed with CAD.

Targeted ultrasound is performed, showing benign cysts within each
breast, some simple and some complicated, corresponding to the sites
of patient's palpable lumps. Largest cyst within the right breast is
at the 12 o'clock axis, 2 cm from the nipple, measuring 2.8 x
cm. Largest cyst within the left breast is at the 11 o'clock axis, 2
cm from the nipple, measuring 2.6 x 1.5 cm. There are no suspicious
solid or cystic masses identified within either breast.
IMPRESSION: No evidence of malignancy within either breast. Benign cysts within
each breast, corresponding to the areas of clinical concern.

RECOMMENDATION:
Screening mammogram in one year.(Code:OJ-I-STM)

I have discussed the findings and recommendations with the patient.
Results were also provided in writing at the conclusion of the
visit. If applicable, a reminder letter will be sent to the patient
regarding the next appointment.

BI-RADS CATEGORY  2: Benign.

## 2019-04-12 IMAGING — US ULTRASOUND RIGHT BREAST LIMITED
1 series · 10 of 10 positions shown · non-contrast
Comparison: Previous mammogram dated 03/25/2007.

CLINICAL DATA: Patient describes palpable lumps within each breast.

EXAM:
2D DIGITAL DIAGNOSTIC BILATERAL MAMMOGRAM WITH CAD AND ADJUNCT TOMO
ULTRASOUND BILATERAL BREAST

[Series 1: ultrasound right breast limited · 0.07mm/px · 10 of 10 slices shown]
[im 1/10]
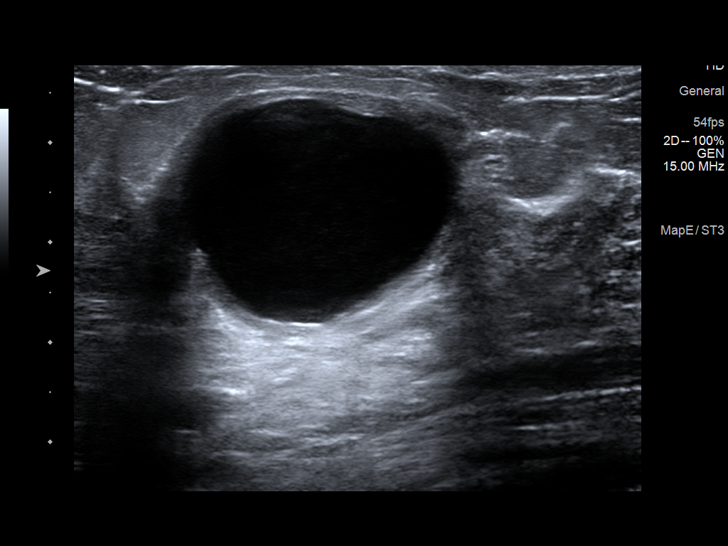
[im 2/10]
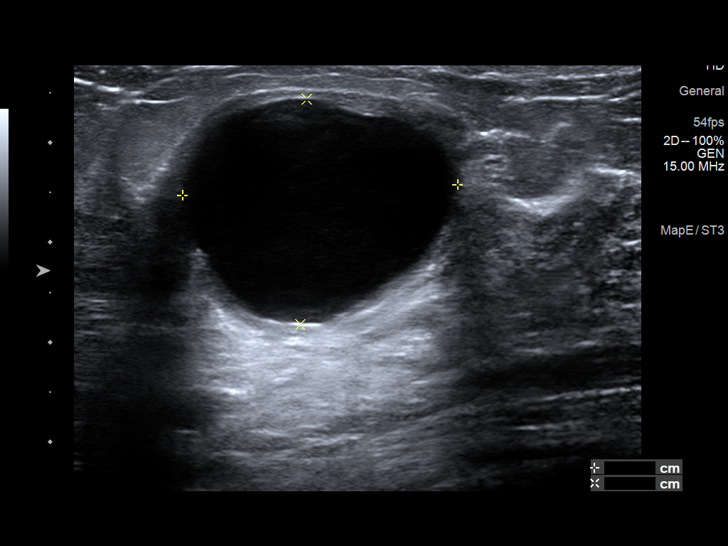
[im 3/10]
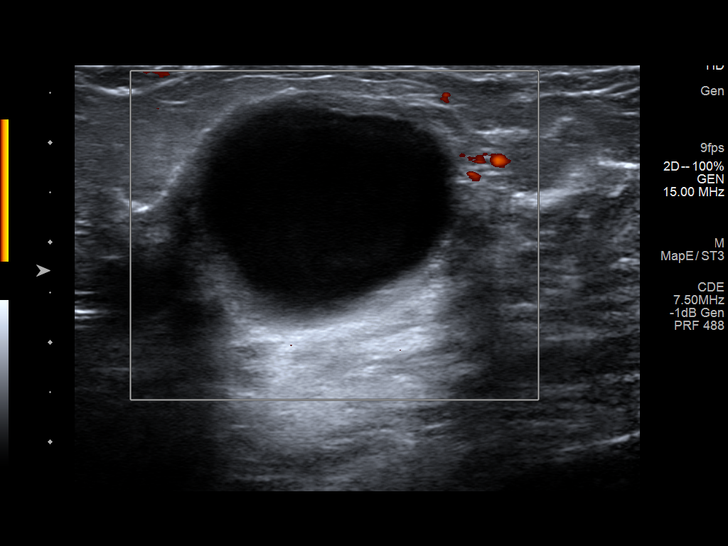
[im 4/10]
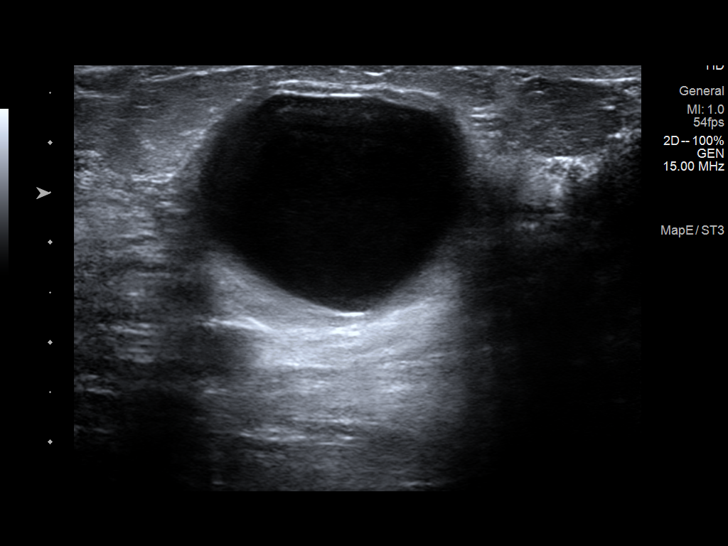
[im 5/10]
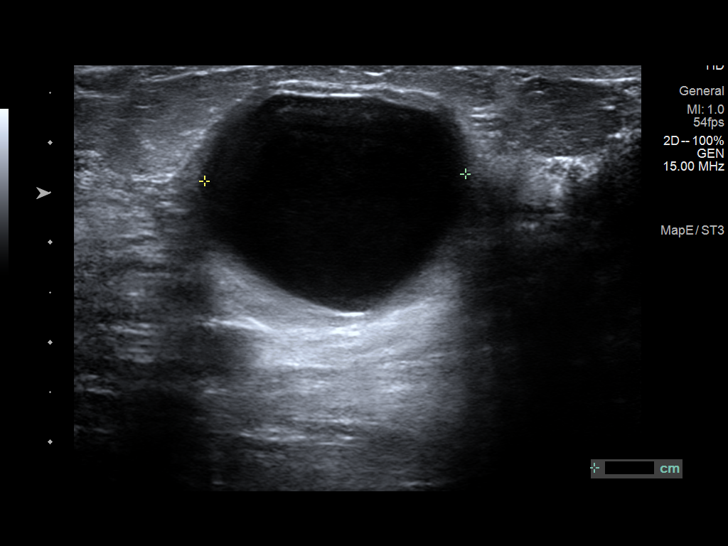
[im 6/10]
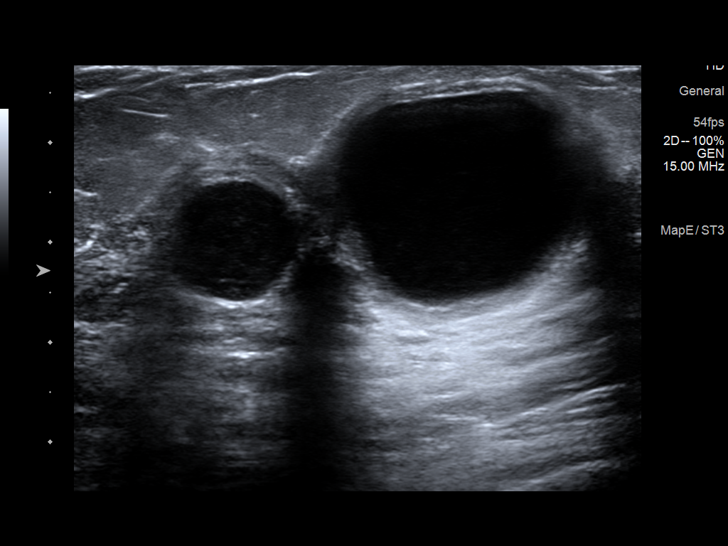
[im 7/10]
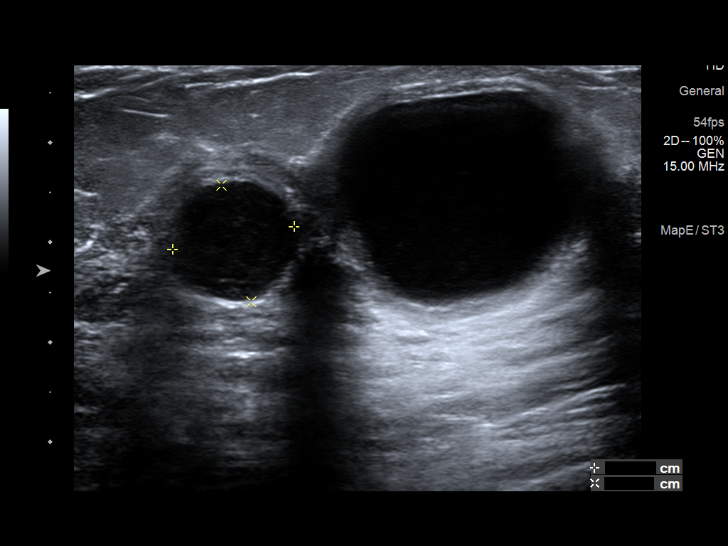
[im 8/10]
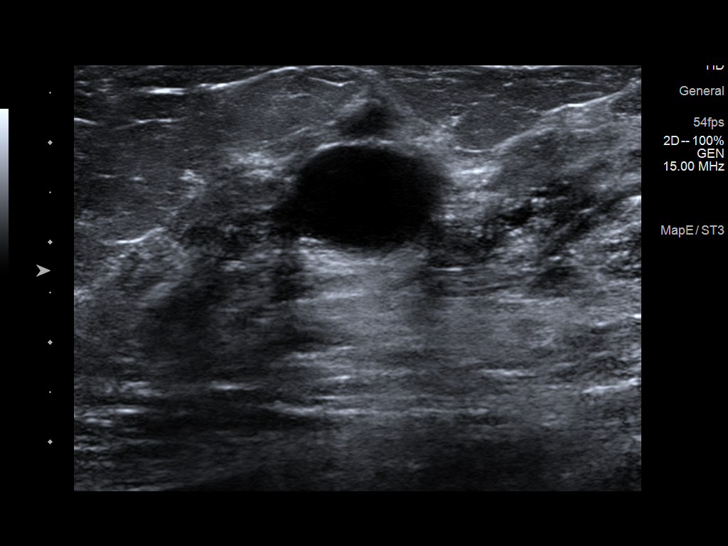
[im 9/10]
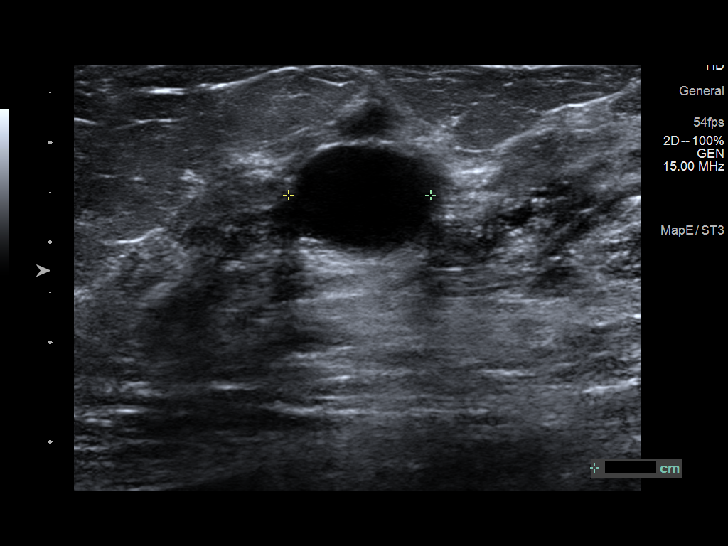
[im 10/10]
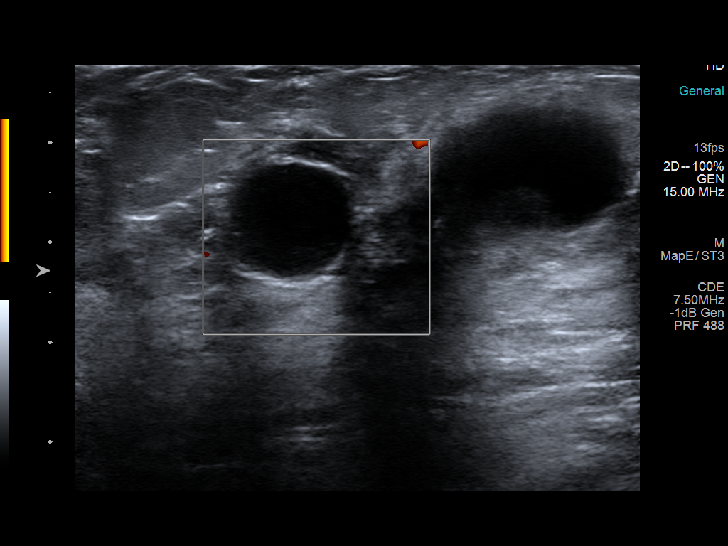

[10 of 10 positions shown; findings below may reference images not displayed]

ACR Breast Density Category d: The breast tissue is extremely dense,
which lowers the sensitivity of mammography.
FINDINGS: There are oval circumscribed masses within the upper breasts
bilaterally, corresponding to the areas of clinical concern. There
are no dominant masses, suspicious calcifications or secondary signs
of malignancy elsewhere within either breast.

Mammographic images were processed with CAD.

Targeted ultrasound is performed, showing benign cysts within each
breast, some simple and some complicated, corresponding to the sites
of patient's palpable lumps. Largest cyst within the right breast is
at the 12 o'clock axis, 2 cm from the nipple, measuring 2.8 x
cm. Largest cyst within the left breast is at the 11 o'clock axis, 2
cm from the nipple, measuring 2.6 x 1.5 cm. There are no suspicious
solid or cystic masses identified within either breast.
IMPRESSION: No evidence of malignancy within either breast. Benign cysts within
each breast, corresponding to the areas of clinical concern.

RECOMMENDATION:
Screening mammogram in one year.(Code:OJ-I-STM)

I have discussed the findings and recommendations with the patient.
Results were also provided in writing at the conclusion of the
visit. If applicable, a reminder letter will be sent to the patient
regarding the next appointment.

BI-RADS CATEGORY  2: Benign.
